# Patient Record
Sex: Female | Born: 1965 | Race: White | Hispanic: No | Marital: Married | Smoking: Never smoker
Health system: Southern US, Community
[De-identification: ages and names within clinical notes are randomized; demographics above are authoritative.]

---

## 2011-08-05 DIAGNOSIS — G43909 Migraine, unspecified, not intractable, without status migrainosus: Secondary | ICD-10-CM | POA: Insufficient documentation

## 2014-06-08 DIAGNOSIS — E669 Obesity, unspecified: Secondary | ICD-10-CM | POA: Insufficient documentation

## 2014-06-08 DIAGNOSIS — R32 Unspecified urinary incontinence: Secondary | ICD-10-CM | POA: Insufficient documentation

## 2014-06-22 DIAGNOSIS — I10 Essential (primary) hypertension: Secondary | ICD-10-CM | POA: Insufficient documentation

## 2015-08-28 DIAGNOSIS — K219 Gastro-esophageal reflux disease without esophagitis: Secondary | ICD-10-CM | POA: Insufficient documentation

## 2020-03-15 ENCOUNTER — Other Ambulatory Visit: Payer: Self-pay

## 2020-03-15 ENCOUNTER — Ambulatory Visit: Payer: BC Managed Care – PPO | Admitting: Podiatry

## 2020-03-15 ENCOUNTER — Ambulatory Visit (INDEPENDENT_AMBULATORY_CARE_PROVIDER_SITE_OTHER): Payer: BC Managed Care – PPO

## 2020-03-15 ENCOUNTER — Encounter: Payer: Self-pay | Admitting: Podiatry

## 2020-03-15 DIAGNOSIS — M722 Plantar fascial fibromatosis: Secondary | ICD-10-CM

## 2020-03-15 DIAGNOSIS — M7661 Achilles tendinitis, right leg: Secondary | ICD-10-CM

## 2020-03-15 MED ORDER — MELOXICAM 15 MG PO TABS
15.0000 mg | ORAL_TABLET | Freq: Every day | ORAL | 3 refills | Status: DC
Start: 2020-03-15 — End: 2020-07-04

## 2020-03-15 MED ORDER — METHYLPREDNISOLONE 4 MG PO TBPK
ORAL_TABLET | ORAL | 0 refills | Status: DC
Start: 2020-03-15 — End: 2020-05-03

## 2020-03-15 NOTE — Progress Notes (Signed)
  Subjective:  Patient ID: Amanda Waller, female    DOB: 05/10/1966,  MRN: [id number] HPI Chief Complaint  Patient presents with  . Foot Pain    Posterior heel right - aching x couple months, AM pain, tried aleve and good shoes-no help  . New Patient (Initial Visit)    54 y.o. female presents with the above complaint.   ROS: Denies fever chills nausea vomiting muscle aches pains calf pain back pain chest pain shortness of breath.  No past medical history on file.   Current Outpatient Medications:  .  PARoxetine (PAXIL-CR) 12.5 MG 24 hr tablet, Take 12.5 mg by mouth daily., Disp: , Rfl:  .  albuterol (VENTOLIN HFA) 108 (90 Base) MCG/ACT inhaler, , Disp: , Rfl:  .  meloxicam (MOBIC) 15 MG tablet, Take 1 tablet (15 mg total) by mouth daily., Disp: 30 tablet, Rfl: 3 .  methylPREDNISolone (MEDROL DOSEPAK) 4 MG TBPK tablet, 6 day dose pack - take as directed, Disp: 21 tablet, Rfl: 0 .  terbinafine (LAMISIL) 250 MG tablet, , Disp: , Rfl:   No Known Allergies Review of Systems Objective:  There were no vitals filed for this visit.  General: Well developed, nourished, in no acute distress, alert and oriented x3   Dermatological: Skin is warm, dry and supple bilateral. Nails x 10 are well maintained; remaining integument appears unremarkable at this time. There are no open sores, no preulcerative lesions, no rash or signs of infection present.  Vascular: Dorsalis Pedis artery and Posterior Tibial artery pedal pulses are 2/4 bilateral with immedate capillary fill time. Pedal hair growth present. No varicosities and no lower extremity edema present bilateral.   Neruologic: Grossly intact via light touch bilateral. Vibratory intact via tuning fork bilateral. Protective threshold with Semmes Wienstein monofilament intact to all pedal sites bilateral. Patellar and Achilles deep tendon reflexes 2+ bilateral. No Babinski or clonus noted bilateral.   Musculoskeletal: No gross boney pedal  deformities bilateral. No pain, crepitus, or limitation noted with foot and ankle range of motion bilateral. Muscular strength 5/5 in all groups tested bilateral.  Gait: Unassisted, Nonantalgic.    Radiographs:  Radiographs taken today demonstrate a plantar and posterior calcaneal heel spur with thickening of soft tissue increase in density plantar fascial pain insertion site and the retrocalcaneal site and the Achilles as well.  Assessment & Plan:   Assessment: Plantar fasciitis with some compensatory Achilles tendinitis right.  Plan: Discussed etiology pathology conservative versus surgical therapy start her on Medrol Dosepak to be followed by meloxicam.  Placed her in a cam boot after injecting 2 mg of dexamethasone subcutaneously at the point of maximal tenderness posterior heel and a plantar fascial calcaneal insertion site with Kenalog and local anesthetic 10 mg and 5 mg respectively.  I will follow-up with her in 1 month     Teague [place] [address] [place], [place]

## 2020-04-26 ENCOUNTER — Ambulatory Visit: Payer: BC Managed Care – PPO | Admitting: Podiatry

## 2020-05-03 ENCOUNTER — Ambulatory Visit: Payer: BC Managed Care – PPO | Admitting: Podiatry

## 2020-05-03 ENCOUNTER — Encounter: Payer: Self-pay | Admitting: Podiatry

## 2020-05-03 ENCOUNTER — Other Ambulatory Visit: Payer: Self-pay

## 2020-05-03 DIAGNOSIS — M7661 Achilles tendinitis, right leg: Secondary | ICD-10-CM

## 2020-05-03 NOTE — Progress Notes (Signed)
She presents today for follow-up of her Achilles tendinitis and her plantar fasciitis.  She states that the tendinitis is still doing well but the plantar fasciitis was doing great until today when she was chasing a chicken through the yard.  Objective: Vital signs are stable alert oriented x3.  Pulses are palpable.  She has pain on palpation lateral aspect of the right heel no pain on palpation of the Achilles or medial aspect of the right heel.  Assessment: Lateral plantar fasciitis.  Plan: I injected that area today she will continue with all other treatment plans.  Follow-up with her in a month if necessary.

## 2020-06-05 ENCOUNTER — Ambulatory Visit: Payer: BC Managed Care – PPO | Admitting: Podiatry

## 2020-07-04 ENCOUNTER — Other Ambulatory Visit: Payer: Self-pay | Admitting: Podiatry

## 2020-11-04 ENCOUNTER — Other Ambulatory Visit: Payer: Self-pay | Admitting: Podiatry

## 2020-11-04 NOTE — Telephone Encounter (Signed)
Please advise 

## 2020-12-22 ENCOUNTER — Other Ambulatory Visit: Payer: Self-pay | Admitting: Otolaryngology

## 2020-12-22 DIAGNOSIS — J342 Deviated nasal septum: Secondary | ICD-10-CM

## 2021-01-18 ENCOUNTER — Ambulatory Visit
Admission: RE | Admit: 2021-01-18 | Discharge: 2021-01-18 | Disposition: A | Discharge status: Home / Self Care | Payer: BC Managed Care – PPO | Source: Ambulatory Visit | Attending: Otolaryngology | Admitting: Otolaryngology

## 2021-01-18 ENCOUNTER — Other Ambulatory Visit: Payer: Self-pay

## 2021-01-18 DIAGNOSIS — J342 Deviated nasal septum: Secondary | ICD-10-CM | POA: Insufficient documentation

## 2021-03-09 ENCOUNTER — Other Ambulatory Visit: Payer: Self-pay | Admitting: Podiatry

## 2021-03-12 NOTE — Telephone Encounter (Signed)
Please advise 

## 2021-07-07 ENCOUNTER — Other Ambulatory Visit: Payer: Self-pay | Admitting: Podiatry

## 2021-10-17 ENCOUNTER — Other Ambulatory Visit: Payer: Self-pay | Admitting: Podiatry

## 2022-03-11 ENCOUNTER — Other Ambulatory Visit: Payer: Self-pay | Admitting: *Deleted

## 2022-03-11 MED ORDER — MELOXICAM 15 MG PO TABS: 15.0000 mg | ORAL_TABLET | Freq: Every day | ORAL | 3 refills | Status: AC

## 2022-11-13 ENCOUNTER — Other Ambulatory Visit: Payer: Self-pay | Admitting: Podiatry

## 2022-11-19 IMAGING — CT CT MAXILLOFACIAL W/O CM
1 of 2 series · 15 of 30 positions shown, 19 images · non-contrast
Comparison: No pertinent prior exams available for comparison.

CLINICAL DATA: Deviated nasal septum. Additional history provided
status post treatment for sinus infection.

EXAM:
CT MAXILLOFACIAL WITHOUT CONTRAST
TECHNIQUE: Multidetector CT images of the paranasal sinuses were obtained using
the standard protocol without intravenous contrast.

[Series 6: (person_name) · axial · 0.37mm/px · z∈[-161,+5]mm · 15 of 186 slices shown, 19 images]
[im 10/186  brain]
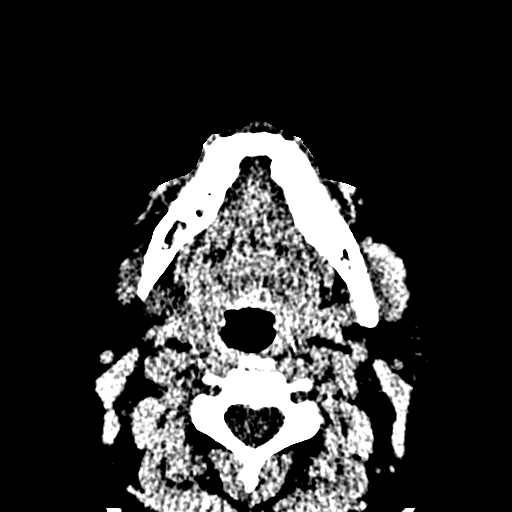
[im 10/186  bone]
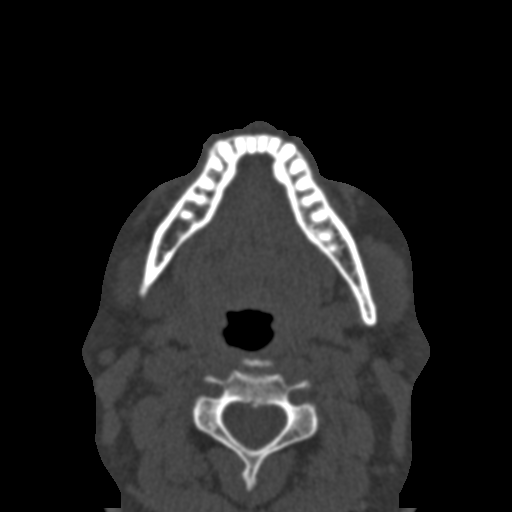
[im 20/186  bone]
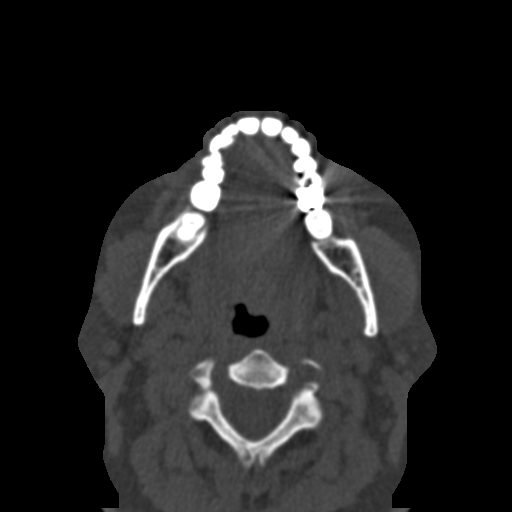
[im 39/186  bone]
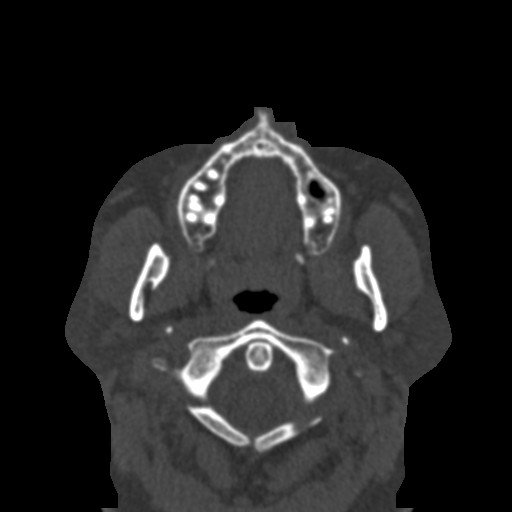
[im 49/186  bone]
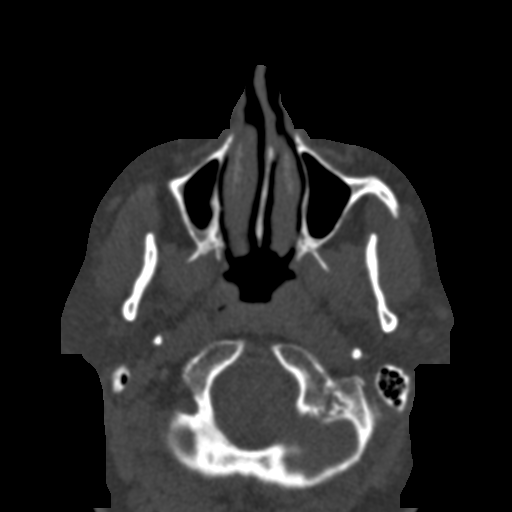
[im 59/186  brain]
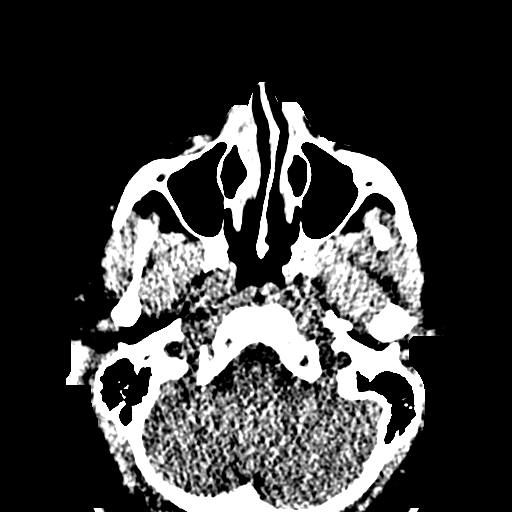
[im 59/186  bone]
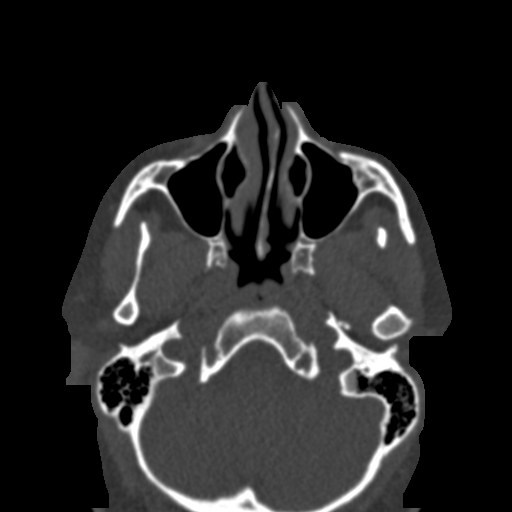
[im 69/186  bone]
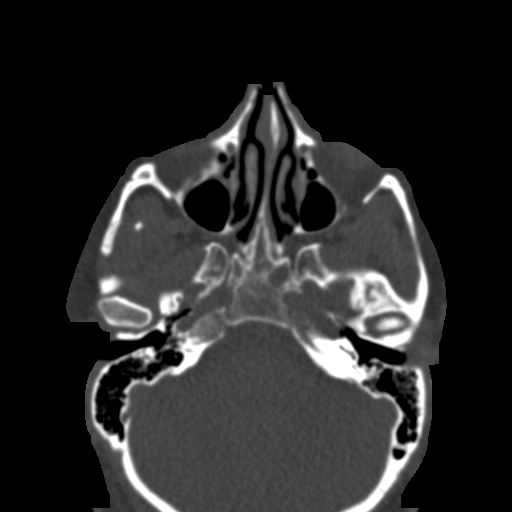
[im 78/186  bone]
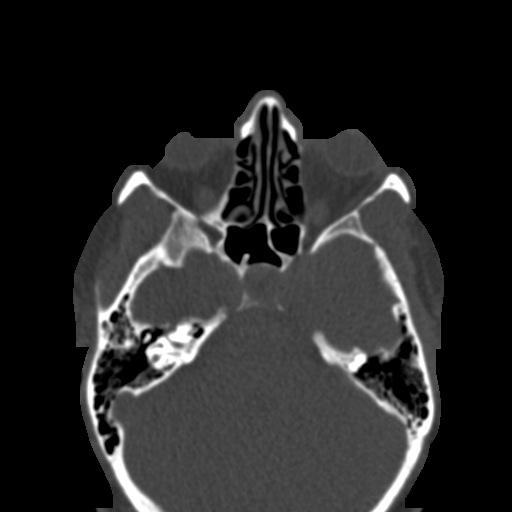
[im 98/186  bone]
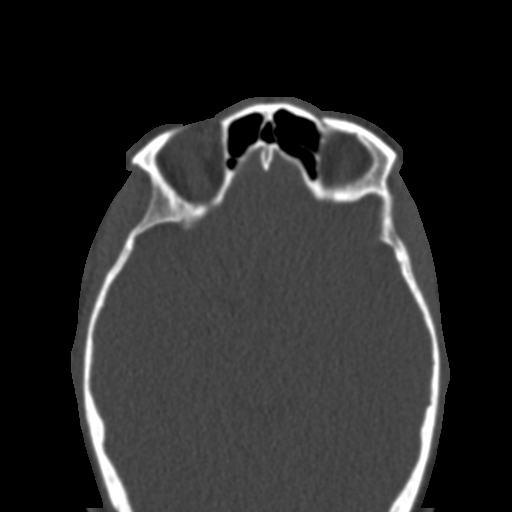
[im 108/186  brain]
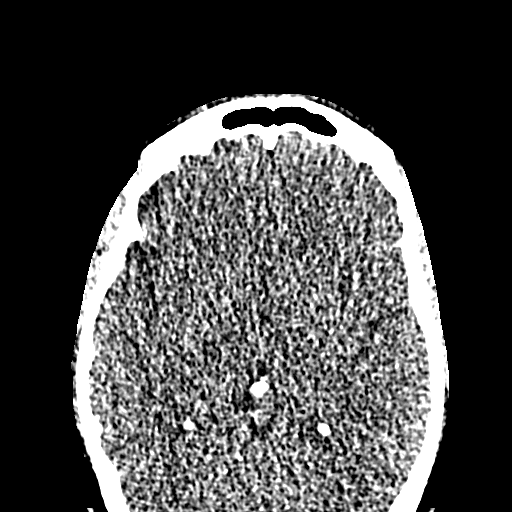
[im 108/186  bone]
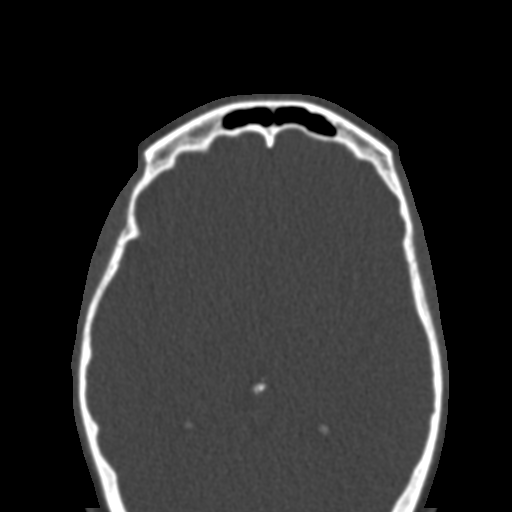
[im 117/186  bone]
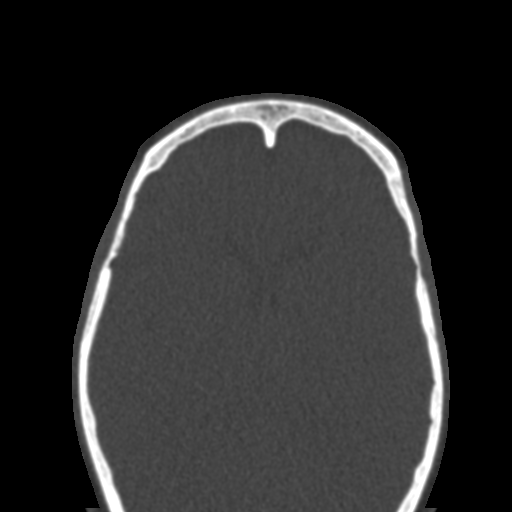
[im 127/186  bone]
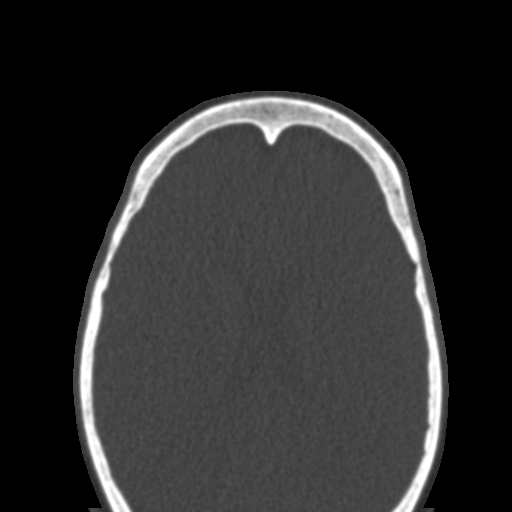
[im 137/186  bone]
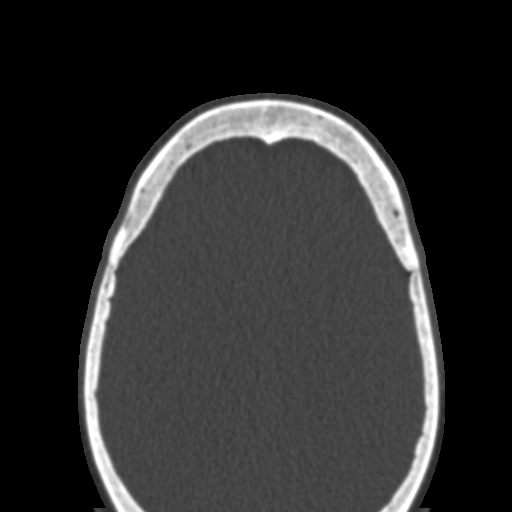
[im 156/186  brain]
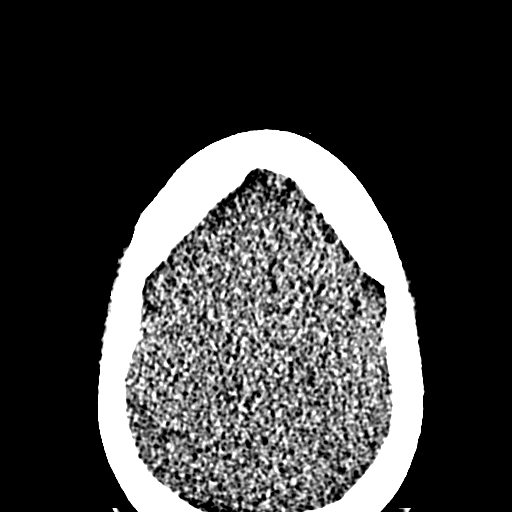
[im 156/186  bone]
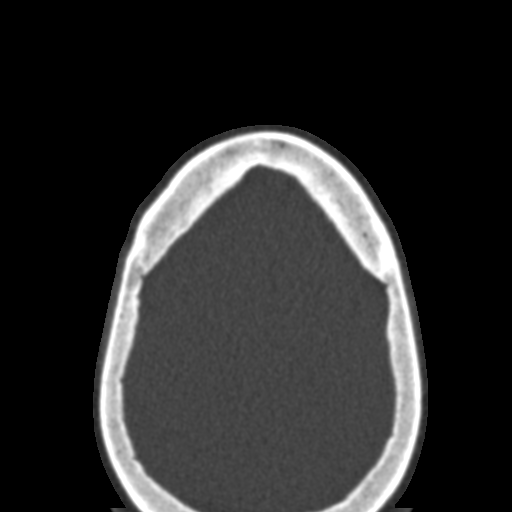
[im 166/186  bone]
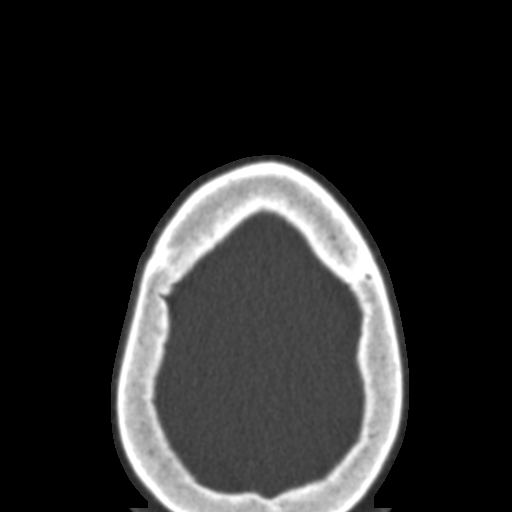
[im 176/186  bone]
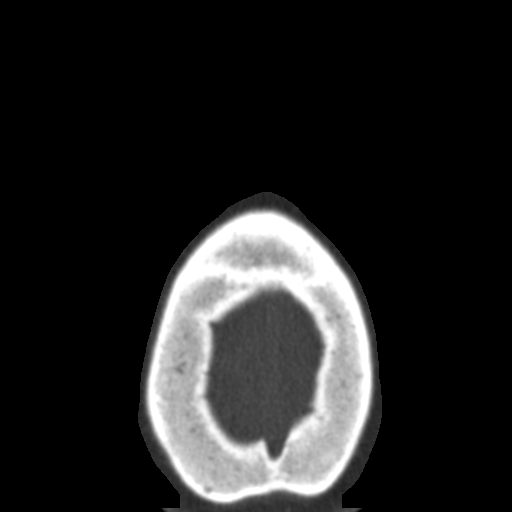

[15 of 30 positions shown; findings below may reference images not displayed]

FINDINGS: Paranasal sinuses:

Frontal: Normally aerated. Patent frontal sinus drainage pathways.

Ethmoid: Normally aerated.

Maxillary: Trace mucosal thickening within the bilateral maxillary
sinuses.

Sphenoid: Normally aerated. Patent sphenoethmoidal recesses.

Right ostiomeatal unit: Patent.

Left ostiomeatal unit: Patent.

Nasal passages: Leftward deviation of the bony nasal septum with
leftward bony spurring. The nasal passages are patent.

Anatomy: No pneumatization is present superior to the anterior
ethmoid notches. Symmetric and intact olfactory grooves and fovea
ethmoidalis, Keros II (4-7mm). Presellar sphenoid pneumatization
pattern.

Other: The sella turcica is slightly expanded in appearance and
appears to be occupied by soft tissue on the limited intracranial
imaging. Congenital nonunion of the posterior arch of C1.

Impression #4 will be called to the ordering clinician or
representative by the Radiologist Assistant, and communication
documented in the PACS or [REDACTED].
IMPRESSION: Trace mucosal thickening within the bilateral maxillary sinuses.

The paranasal sinuses are otherwise normally aerated. Patent sinus
drainage pathways.

Leftward deviation of the bony nasal septum with leftward bony
spurring.

The sella turcica is slightly expanded in appearance and appears to
be occupied by soft tissue on the limited intracranial imaging. A
pituitary protocol brain MRI is recommended for further evaluation
and to exclude a pituitary mass.

## 2023-01-08 ENCOUNTER — Ambulatory Visit: Payer: BC Managed Care – PPO | Admitting: Podiatry

## 2023-01-08 DIAGNOSIS — L601 Onycholysis: Secondary | ICD-10-CM | POA: Diagnosis not present

## 2023-01-08 DIAGNOSIS — L03031 Cellulitis of right toe: Secondary | ICD-10-CM

## 2023-01-08 MED ORDER — CEPHALEXIN 500 MG PO CAPS: 500.0000 mg | ORAL_CAPSULE | Freq: Three times a day (TID) | ORAL | 0 refills | Status: AC

## 2023-01-08 NOTE — Patient Instructions (Signed)

## 2023-01-12 NOTE — Progress Notes (Signed)
  Subjective:  Patient ID: Amanda Waller, female    DOB: 02/04/1966,  MRN: [id number]  Chief Complaint  Patient presents with   Nail Problem    R foot great nail discolored and detatched clear disharge    57 y.o. female presents with the above complaint. History confirmed with patient.   Objective:  Physical Exam: warm, good capillary refill, no trophic changes or ulcerative lesions, normal DP and PT pulses, normal sensory exam, and onycholysis with lifting of nail plate right great toe  Assessment:   1. Onycholysis   2. Paronychia of great toe of right foot      Plan:  Patient was evaluated and treated and all questions answered.    Detached nail, right -Patient elects to proceed with minor surgery to remove detached toenail today. Consent reviewed and signed by patient. -R great toe nail excised. See procedure note. -Educated on post-procedure care including soaking. Written instructions provided and reviewed. -Rx for Keflex sent to pharmacy.    Procedure: Excision of detached Toenail Location: Right 1st toe  nail  Anesthesia: Lidocaine 1% plain; 1.5 mL and Marcaine 0.5% plain; 1.5 mL, digital block. Skin Prep: Betadine. Dressing: Silvadene; telfa; dry, sterile, compression dressing. Technique: Following skin prep, the toe was exsanguinated and a tourniquet was secured at the base of the toe. The affected nail was freed,   and excised,  and irrigated out with alcohol. The tourniquet was then removed and sterile dressing applied. Disposition: Patient tolerated procedure well.   Return if symptoms worsen or fail to improve.

## 2023-01-27 ENCOUNTER — Ambulatory Visit: Payer: BC Managed Care – PPO | Admitting: Podiatry

## 2023-03-21 ENCOUNTER — Ambulatory Visit
Admission: EM | Admit: 2023-03-21 | Discharge: 2023-03-21 | Disposition: A | Discharge status: Home / Self Care | Payer: BC Managed Care – PPO | Attending: Family Medicine | Admitting: Family Medicine

## 2023-03-21 DIAGNOSIS — H00025 Hordeolum internum left lower eyelid: Secondary | ICD-10-CM | POA: Diagnosis not present

## 2023-03-21 MED ORDER — ERYTHROMYCIN 5 MG/GM OP OINT: TOPICAL_OINTMENT | OPHTHALMIC | 0 refills | Status: AC

## 2023-03-21 NOTE — ED Provider Notes (Signed)
[facility]    CSN: [id number] Arrival date & time: 03/21/23  0816      History   Chief Complaint Chief Complaint  Patient presents with   Eye Problem    HPI HPI  Amanda Waller is a 57 y.o. female.    Amanda Waller presents for left eye pain and eyelid swelling that started 2 days ago. Amanda Waller feels like something is in her eye but does not see anything. Tried sterile waer rinses prior to arrival.   Amanda Waller does not wear contacts but does wear glasses.  Amanda Waller has some blurry vision.  Amanda Waller has otherwise been well and has no additional concerns today.    History reviewed. No pertinent past medical history.  Patient Active Problem List   Diagnosis Date Noted   Gastroesophageal reflux disease without esophagitis 08/28/2015   Essential hypertension 06/22/2014   Incontinence 06/08/2014   Obesity (BMI 30-39.9) 06/08/2014   Anemia 03/11/2013   Major depressive disorder, recurrent episode, moderate (HCC) 08/05/2011   Migraine 08/05/2011   Mixed hyperlipidemia 08/05/2011   Obstructive sleep apnea syndrome 08/05/2011   Rhinitis 08/05/2011    History reviewed. No pertinent surgical history.  OB History   No obstetric history on file.      Home Medications    Prior to Admission medications   Medication Sig Start Date End Date Taking? Authorizing Provider  cetirizine (ZYRTEC) 10 MG tablet Take by mouth. 03/10/13  Yes [provider]  erythromycin ophthalmic ointment Place a 1/2 inch ribbon of ointment into the lower eyelid 3 times a day for 7 days. 03/21/23  Yes Alanah Sakuma, DO  levothyroxine (SYNTHROID) 50 MCG tablet 1 TAB BY MOUTH DAILY TAKE ON AN EMPTY STOMACH WITH WATER AT LEAST 30-60 MIN BEFORE BREAKFAST. 08/30/21  Yes [provider]  albuterol (VENTOLIN HFA) 108 (90 Base) MCG/ACT inhaler  11/22/19   [provider]  hydrochlorothiazide (HYDRODIURIL) 25 MG tablet Take 25 mg by mouth daily.    [provider]   meloxicam (MOBIC) 15 MG tablet Take 1 tablet (15 mg total) by mouth daily. 03/11/22   Hyatt, Max T, DPM  PARoxetine (PAXIL-CR) 12.5 MG 24 hr tablet Take 12.5 mg by mouth daily.    [provider]  terbinafine (LAMISIL) 250 MG tablet  03/03/20   [provider]    Family History History reviewed. No pertinent family history.  Social History Social History   Tobacco Use   Smoking status: Never   Smokeless tobacco: Never  Vaping Use   Vaping Use: Never used  Substance Use Topics   Alcohol use: Not Currently   Drug use: Never     Allergies   Patient has no known allergies.   Review of Systems Review of Systems : negative unless otherwise stated in HPI.      Physical Exam Triage Vital Signs ED Triage Vitals  Enc Vitals Group     BP --      Pulse --      Resp --      Temp --      Temp src --      SpO2 --      Weight 03/21/23 0827 215 lb (97.5 kg)     Height 03/21/23 0827 5\' 3"  (1.6 m)     Head Circumference --      Peak Flow --      Pain Score 03/21/23 0826 7     Pain Loc --      Pain Edu? --  Excl. in GC? --    No data found.  Updated Vital Signs BP 131/81 (BP Location: Left Arm)   Pulse (!) 54   Temp 98 F (36.7 C) (Oral)   Ht 5\' 3"  (1.6 m)   Wt 97.5 kg   SpO2 96%   BMI 38.09 kg/m   Visual Acuity Right Eye Distance: 20/25 Left Eye Distance: 20/25 Bilateral Distance: 20/25  Right Eye Near:   Left Eye Near:    Bilateral Near:   (Pt wears Glasses)  Physical Exam  GEN: pleasant well appearing female, in no acute distress  NECK: normal ROM  RESP: no increased work of breathing EYES:     General: Wearing glasses, Lids are everted, no foreign bodies appreciated. Vision grossly intact. Gaze aligned appropriately.        Right eye: No discharge, hordeolum       Left eye: No foreign body, discharge. + internal lower hordeolum.     Extraocular Movements: Extraocular movements intact.     Conjunctiva/sclera:     Left eye: Left  conjunctiva is mildly injected. No chemosis or hemorrhage. SKIN: warm and dry   UC Treatments / Results  Labs (all labs ordered are listed, but only abnormal results are displayed) Labs Reviewed - No data to display  EKG   Radiology No results found.  Procedures Procedures (including critical care time)  Medications Ordered in UC Medications - No data to display  Initial Impression / Assessment and Plan / UC Course  I have reviewed the triage vital signs and the nursing notes.  Pertinent labs & imaging results that were available during my care of the patient were reviewed by me and considered in my medical decision making (see chart for details).     Patient is a 57 y.o. female who presents after left eye pain for the past  2 days.  Vision screening 20/25, congruent on both eyes. On exam, she has a evidence of internal lower hordeolum on the left. Treat with erythromycin ointment.  Advised to follow-up with an ophthalmologist or optometrist, if  discomfort/pain is not improving after 7day course. Understanding voiced.   Discussed MDM, treatment plan and plan for follow-up with patient who agrees with plan.  Final Clinical Impressions(s) / UC Diagnoses   Final diagnoses:  Hordeolum internum of left lower eyelid     Discharge Instructions      Stop by the pharmacy to pick up your antibiotic eye medication.  Follow up with your primary eyecare provider or [facility] if symptoms suddenly worsen or you have little improvement in your eye symptoms.       ED Prescriptions     Medication Sig Dispense Auth. Provider   erythromycin ophthalmic ointment Place a 1/2 inch ribbon of ointment into the lower eyelid 3 times a day for 7 days. 3.5 g Katha Cabal, DO      PDMP not reviewed this encounter.   Katha Cabal, DO 03/21/23 [phone]

## 2023-03-21 NOTE — ED Triage Notes (Signed)
Pt c/o left eye swelling and soreness x2days  Pt states that her vision is blurry

## 2023-03-21 NOTE — Discharge Instructions (Addendum)
Stop by the pharmacy to pick up your antibiotic eye medication.  Follow up with your primary eyecare provider or [facility] if symptoms suddenly worsen or you have little improvement in your eye symptoms.    

## 2023-08-21 ENCOUNTER — Ambulatory Visit: Payer: BC Managed Care – PPO | Admitting: Podiatry

## 2023-08-21 ENCOUNTER — Encounter: Payer: Self-pay | Admitting: Podiatry

## 2023-08-21 VITALS — Ht 63.0 in | Wt 215.0 lb

## 2023-08-21 DIAGNOSIS — M722 Plantar fascial fibromatosis: Secondary | ICD-10-CM | POA: Diagnosis not present

## 2023-08-21 MED ORDER — MELOXICAM 15 MG PO TABS
15.0000 mg | ORAL_TABLET | Freq: Every day | ORAL | 0 refills | Status: DC
Start: 2023-08-21 — End: 2023-09-22

## 2023-08-21 MED ORDER — METHYLPREDNISOLONE 4 MG PO TBPK: ORAL_TABLET | ORAL | 0 refills | Status: AC

## 2023-08-21 NOTE — Progress Notes (Signed)
  Subjective:  Patient ID: Amanda Waller, female    DOB: [birth year],  MRN: [id number]  Chief Complaint  Patient presents with   Foot Pain    Patient is here for right heel pain    57 y.o. female presents with the above complaint.  Patient presents with right plantar fasciitis pain.  She states she has been having this for quite some time is progressive gotten worse worse with ambulation worse with pressure.  She would like to discuss treatment options for this.  Pain scale 7 out of 10 dull achy in nature hurts with taking for step in the morning   Review of Systems: Negative except as noted in the HPI. Denies N/V/F/Ch.  History reviewed. No pertinent past medical history.  Current Outpatient Medications:    albuterol (VENTOLIN HFA) 108 (90 Base) MCG/ACT inhaler, , Disp: , Rfl:    cetirizine (ZYRTEC) 10 MG tablet, Take by mouth., Disp: , Rfl:    erythromycin ophthalmic ointment, Place a 1/2 inch ribbon of ointment into the lower eyelid 3 times a day for 7 days., Disp: 3.5 g, Rfl: 0   hydrochlorothiazide (HYDRODIURIL) 25 MG tablet, Take 25 mg by mouth daily., Disp: , Rfl:    levothyroxine (SYNTHROID) 50 MCG tablet, 1 TAB BY MOUTH DAILY TAKE ON AN EMPTY STOMACH WITH WATER AT LEAST 30-60 MIN BEFORE BREAKFAST., Disp: , Rfl:    meloxicam (MOBIC) 15 MG tablet, Take 1 tablet (15 mg total) by mouth daily., Disp: 30 tablet, Rfl: 3   meloxicam (MOBIC) 15 MG tablet, Take 1 tablet (15 mg total) by mouth daily., Disp: 30 tablet, Rfl: 0   methylPREDNISolone (MEDROL DOSEPAK) 4 MG TBPK tablet, Take as directed, Disp: 21 each, Rfl: 0   PARoxetine (PAXIL-CR) 12.5 MG 24 hr tablet, Take 12.5 mg by mouth daily., Disp: , Rfl:    terbinafine (LAMISIL) 250 MG tablet, , Disp: , Rfl:   Social History   Tobacco Use  Smoking Status Never  Smokeless Tobacco Never    No Known Allergies Objective:  There were no vitals filed for this visit. Body mass index is 38.09 kg/m. Constitutional Well developed. Well  nourished.  Vascular Dorsalis pedis pulses palpable bilaterally. Posterior tibial pulses palpable bilaterally. Capillary refill normal to all digits.  No cyanosis or clubbing noted. Pedal hair growth normal.  Neurologic Normal speech. Oriented to person, place, and time. Epicritic sensation to light touch grossly present bilaterally.  Dermatologic Nails well groomed and normal in appearance. No open wounds. No skin lesions.  Orthopedic: Normal joint ROM without pain or crepitus bilaterally. No visible deformities. Tender to palpation at the calcaneal tuber right. No pain with calcaneal squeeze right. Ankle ROM diminished range of motion right. Silfverskiold Test: positive right.   Radiographs: T none  Assessment:   1. Plantar fasciitis of right foot    Plan:  Patient was evaluated and treated and all questions answered.  Plantar Fasciitis, right - XR reviewed as above.  - Educated on icing and stretching. Instructions given.  -We will hold off on steroid injection for now as she is afraid of them - DME: Plantar fascial brace dispensed to support the medial longitudinal arch of the foot and offload pressure from the heel and prevent arch collapse during weightbearing - Pharmacologic management: None -If there is no improvement we will discuss steroid injection under next visit

## 2023-09-20 ENCOUNTER — Other Ambulatory Visit: Payer: Self-pay | Admitting: Podiatry

## 2023-09-23 ENCOUNTER — Ambulatory Visit: Payer: BC Managed Care – PPO | Admitting: Podiatry
